# Patient Record
Sex: Male | Born: 1975 | Race: White | Hispanic: No | Marital: Single | Smoking: Never smoker
Health system: Southern US, Community
[De-identification: ages and names within clinical notes are randomized; demographics above are authoritative.]

## PROBLEM LIST (undated history)

## (undated) DIAGNOSIS — E119 Type 2 diabetes mellitus without complications: Secondary | ICD-10-CM

## (undated) DIAGNOSIS — I10 Essential (primary) hypertension: Secondary | ICD-10-CM

## (undated) HISTORY — PX: HERNIA REPAIR: SHX51

---

## 2008-01-25 ENCOUNTER — Emergency Department: Payer: Self-pay | Admitting: Emergency Medicine

## 2017-03-04 ENCOUNTER — Other Ambulatory Visit: Payer: Self-pay | Admitting: Family Medicine

## 2017-03-04 ENCOUNTER — Ambulatory Visit
Admission: RE | Admit: 2017-03-04 | Discharge: 2017-03-04 | Disposition: A | Discharge status: Home / Self Care | Payer: BLUE CROSS/BLUE SHIELD | Source: Ambulatory Visit | Attending: Family Medicine | Admitting: Family Medicine

## 2017-03-04 DIAGNOSIS — R197 Diarrhea, unspecified: Secondary | ICD-10-CM

## 2017-03-04 DIAGNOSIS — R1084 Generalized abdominal pain: Secondary | ICD-10-CM

## 2017-03-04 DIAGNOSIS — N136 Pyonephrosis: Secondary | ICD-10-CM | POA: Diagnosis not present

## 2017-03-04 DIAGNOSIS — M545 Low back pain: Secondary | ICD-10-CM | POA: Insufficient documentation

## 2017-03-04 DIAGNOSIS — I700 Atherosclerosis of aorta: Secondary | ICD-10-CM | POA: Insufficient documentation

## 2017-03-04 HISTORY — DX: Essential (primary) hypertension: I10

## 2017-03-04 HISTORY — DX: Type 2 diabetes mellitus without complications: E11.9

## 2017-03-04 LAB — POCT I-STAT CREATININE: Creatinine, Ser: 0.8 mg/dL (ref 0.61–1.24)

## 2017-03-04 MED ORDER — IOPAMIDOL (ISOVUE-300) INJECTION 61%
100.0000 mL | Freq: Once | INTRAVENOUS | Status: AC | PRN
  Administered 2017-03-04: 100 mL via INTRAVENOUS

## 2017-03-05 ENCOUNTER — Encounter: Payer: Self-pay | Admitting: Urology

## 2017-03-05 ENCOUNTER — Ambulatory Visit (INDEPENDENT_AMBULATORY_CARE_PROVIDER_SITE_OTHER): Payer: BLUE CROSS/BLUE SHIELD | Admitting: Urology

## 2017-03-05 ENCOUNTER — Ambulatory Visit: Payer: BLUE CROSS/BLUE SHIELD

## 2017-03-05 VITALS — BP 156/103 | HR 108 | Ht 70.0 in | Wt 189.9 lb

## 2017-03-05 DIAGNOSIS — N200 Calculus of kidney: Secondary | ICD-10-CM | POA: Diagnosis not present

## 2017-03-05 LAB — URINALYSIS, COMPLETE
Bilirubin, UA: NEGATIVE
Glucose, UA: NEGATIVE
Ketones, UA: NEGATIVE
LEUKOCYTES UA: NEGATIVE
Nitrite, UA: NEGATIVE
PH UA: 5.5 (ref 5.0–7.5)
Specific Gravity, UA: >1.03 — ABNORMAL HIGH (ref 1.005–1.030)
Urobilinogen, Ur: 0.2 mg/dL (ref 0.2–1.0)

## 2017-03-05 LAB — MICROSCOPIC EXAMINATION
Epithelial Cells (non renal): NONE SEEN /HPF
WBC, UA: NONE SEEN /HPF

## 2017-03-05 MED ORDER — ONDANSETRON HCL 4 MG PO TABS: 4.0000 mg | ORAL_TABLET | Freq: Three times a day (TID) | ORAL | 0 refills | Status: AC | PRN

## 2017-03-05 MED ORDER — TAMSULOSIN HCL 0.4 MG PO CAPS: 0.4000 mg | ORAL_CAPSULE | Freq: Every day | ORAL | 0 refills | Status: AC

## 2017-03-05 MED ORDER — TRAMADOL HCL 50 MG PO TABS: 50.0000 mg | ORAL_TABLET | Freq: Four times a day (QID) | ORAL | 0 refills | Status: DC | PRN

## 2017-03-05 NOTE — Progress Notes (Signed)
03/05/2017 2:19 PM   Luis Dickerson Nov 21, 1975 [id number]  Referring provider: Danella Penton, MD [phone] [facility] [address] [facility] [place], [place] [address]  Chief Complaint  Patient presents with  . New Patient (Initial Visit)  . Nephrolithiasis    HPI: 41 year old male who presents today for further evaluation of her right kidney stone. The patient was initially seen and evaluated by his primary care doctor who ordered a CT scan for back pain and chronic malaise. He was noted to have a 6-7 mm stone in the right UPJ with associated proximal hydronephrosis.  He also has bilateral nonobstructing stones. Patient denies any fevers or chills. He did have some nausea and vomiting several days ago. His pain has been present intermittently for the past 5 days.  The patient has no history of kidney stones. Does have a strong family history of kidney stones.   PMH: Past Medical History:  Diagnosis Date  . Diabetes mellitus without complication (HCC)   . Hypertension     Surgical History: Past Surgical History:  Procedure Laterality Date  . HERNIA REPAIR     58 or 41 years old    Home Medications:  Allergies as of 03/05/2017   No Known Allergies     Medication List       Accurate as of 03/05/17  2:19 PM. Always use your most recent med list.          amLODipine 5 MG tablet Commonly known as:  NORVASC TAKE 1 TABLET BY MOUTH EVERY NIGHT FOR BLOOD PRESSURE   cetirizine 10 MG tablet Commonly known as:  ZYRTEC Take by mouth.   ciprofloxacin 500 MG tablet Commonly known as:  CIPRO Take by mouth.   ondansetron 4 MG tablet Commonly known as:  ZOFRAN Take 1 tablet (4 mg total) by mouth every 8 (eight) hours as needed for nausea or vomiting.   tamsulosin 0.4 MG Caps capsule Commonly known as:  FLOMAX Take 1 capsule (0.4 mg total) by mouth daily.   traMADol 50 MG tablet Commonly known as:  ULTRAM Take 1 tablet (50 mg total) by mouth every 6 (six)  hours as needed.            Discharge Care Instructions        Start     Ordered   03/05/17 0000  Urinalysis, Complete     03/05/17 1310   03/05/17 0000  tamsulosin (FLOMAX) 0.4 MG CAPS capsule  Daily     03/05/17 1417   03/05/17 0000  traMADol (ULTRAM) 50 MG tablet  Every 6 hours PRN     03/05/17 1417   03/05/17 0000  ondansetron (ZOFRAN) 4 MG tablet  Every 8 hours PRN     03/05/17 1417   03/05/17 0000  Abdomen 1 view (KUB)    Question Answer Comment  Reason for Exam (SYMPTOM  OR DIAGNOSIS REQUIRED) kidney stone   Preferred imaging location? [facility]   Radiology Contrast Protocol - do NOT remove file path [email or web address]      03/05/17 1417      Allergies: No Known Allergies  Family History: Family History  Problem Relation Age of Onset  . Nephrolithiasis Father   . Nephrolithiasis Paternal Grandfather   . Prostate cancer Paternal Grandfather   . Kidney cancer Neg Hx   . Kidney disease Neg Hx     Social History:  reports that he has never smoked. He has never used smokeless tobacco. He reports that he  drinks about 1.2 oz of alcohol per week . He reports that he does not use drugs.  ROS: UROLOGY Frequent Urination?: Yes Hard to postpone urination?: No Burning/pain with urination?: No Get up at night to urinate?: No Leakage of urine?: No Urine stream starts and stops?: No Trouble starting stream?: No Do you have to strain to urinate?: Yes Blood in urine?: No Urinary tract infection?: Yes Sexually transmitted disease?: No Injury to kidneys or bladder?: No Painful intercourse?: No Weak stream?: No Erection problems?: No Penile pain?: No  Gastrointestinal Nausea?: No Vomiting?: Yes Indigestion/heartburn?: No Diarrhea?: No Constipation?: No  Constitutional Fever: No Night sweats?: No Weight loss?: No Fatigue?: No  Skin Skin rash/lesions?: No Itching?: No  Eyes Blurred vision?:  Yes Double vision?: No  Ears/Nose/Throat Sore throat?: No Sinus problems?: No  Hematologic/Lymphatic Swollen glands?: No Easy bruising?: No  Cardiovascular Leg swelling?: No Chest pain?: No  Respiratory Cough?: No Shortness of breath?: No  Endocrine Excessive thirst?: Yes  Musculoskeletal Back pain?: Yes Joint pain?: Yes  Neurological Headaches?: No Dizziness?: No  Psychologic Depression?: No Anxiety?: No  Physical Exam: BP (!) 156/103   Pulse (!) 108   Ht 5\' 10"  (1.778 m)   Wt 86.1 kg (189 lb 14.4 oz)   BMI 27.25 kg/m   Constitutional:  Alert and oriented, No acute distress. HEENT: [place] AT, moist mucus membranes.  Trachea midline, no masses. Cardiovascular: No clubbing, cyanosis, or edema. Respiratory: Normal respiratory effort, no increased work of breathing. GI: Abdomen is soft, nontender, nondistended, no abdominal masses GU: No CVA tenderness.  Skin: No rashes, bruises or suspicious lesions. Lymph: No cervical or inguinal adenopathy. Neurologic: Grossly intact, no focal deficits, moving all 4 extremities. Psychiatric: Normal mood and affect.  Laboratory Data: No results found for: WBC, HGB, HCT, MCV, PLT  Lab Results  Component Value Date   CREATININE 0.80 03/04/2017    No results found for: PSA  No results found for: TESTOSTERONE  No results found for: HGBA1C  Urinalysis No results found for: COLORURINE, APPEARANCEUR, LABSPEC, PHURINE, GLUCOSEU, HGBUR, BILIRUBINUR, KETONESUR, PROTEINUR, UROBILINOGEN, NITRITE, LEUKOCYTESUR  Pertinent Imaging: I manipulated and reviewed the patient's CT scan demonstrating a 6-7 mm stone at the right UPJ. He also has bilateral nonobstructing stones.   Assessment & Plan:  I went over the treatment options with the patient and his current clinical situation. We discussed medical expulsion therapy, shockwave lithotripsy, and ureteroscopy. Currently, the patient's symptoms are fairly mild, and the patient feels  that he would like to try medical expulsion therapy for a period of time prior to deciding on any treatment. We discussed return precautions including fever, dehydration, and poorly controlled pain. I detailed the pain regimen for him including 800 mg Diprivan as well as tramadol. I have also given the patient tamsulosin to help with his medical expulsion therapy as well as Zofran for his nausea. We'll plan to follow-up the patient to exit KUB prior.  1. Kidney stones  - Urinalysis, Complete - Abdomen 1 view (KUB); Future   Return in about 2 weeks (around 03/19/2017).  Crist Fat, MD  [facility] [address] [place], [place] [address] [phone]

## 2017-03-07 NOTE — Progress Notes (Addendum)
03/08/2017 9:40 AM   Luis Dickerson Nov 30, 1975 [id number]  Referring provider: Rusty Aus, MD [facility] [place], [place] [address]  Chief Complaint  Patient presents with  . Follow-up    Discuss eswl     HPI: 41 year old WM who presents today to discuss ESWL as a possible definitive treatment for his right UPJ stone.  The patient was initially seen and evaluated by his primary care doctor who ordered a CT scan for back pain and chronic malaise. He was noted to have a 6-7 mm stone in the right UPJ with associated proximal hydronephrosis.  He also has bilateral nonobstructing stones. Patient denies any fevers or chills. He did have some nausea and vomiting several days ago. His pain has been present intermittently for the past 5 days.  The patient has no history of kidney stones. Does have a strong family history of kidney stones.  Today, he states he has not passed the stone.  He is still experiencing intermittent right-sided flank pain. He is not having fevers, chills, nausea or vomiting. He is not having gross hematuria.  His UA today is unremarkable.   PMH: Past Medical History:  Diagnosis Date  . Diabetes mellitus without complication ([place])   . Hypertension     Surgical History: Past Surgical History:  Procedure Laterality Date  . HERNIA REPAIR     74 or 41 years old    Home Medications:  Allergies as of 03/08/2017   No Known Allergies     Medication List       Accurate as of 03/08/17  9:40 AM. Always use your most recent med list.          amLODipine 5 MG tablet Commonly known as:  NORVASC TAKE 1 TABLET BY MOUTH EVERY NIGHT FOR BLOOD PRESSURE   cetirizine 10 MG tablet Commonly known as:  ZYRTEC Take by mouth.   ciprofloxacin 500 MG tablet Commonly known as:  CIPRO Take by mouth.   ondansetron 4 MG tablet Commonly known as:  ZOFRAN Take 1 tablet (4 mg total) by mouth every 8 (eight) hours as needed for  nausea or vomiting.   tamsulosin 0.4 MG Caps capsule Commonly known as:  FLOMAX Take 1 capsule (0.4 mg total) by mouth daily.   traMADol 50 MG tablet Commonly known as:  ULTRAM Take 1 tablet (50 mg total) by mouth every 6 (six) hours as needed.       Allergies: No Known Allergies  Family History: Family History  Problem Relation Age of Onset  . Nephrolithiasis Father   . Nephrolithiasis Paternal Grandfather   . Prostate cancer Paternal Grandfather   . Kidney cancer Neg Hx   . Kidney disease Neg Hx   . Bladder Cancer Neg Hx     Social History:  reports that he has never smoked. He has never used smokeless tobacco. He reports that he drinks about 1.2 oz of alcohol per week . He reports that he does not use drugs.  ROS: UROLOGY Frequent Urination?: No Hard to postpone urination?: No Burning/pain with urination?: No Get up at night to urinate?: No Leakage of urine?: No Urine stream starts and stops?: No Trouble starting stream?: No Do you have to strain to urinate?: No Blood in urine?: No Urinary tract infection?: No Sexually transmitted disease?: No Injury to kidneys or bladder?: No Painful intercourse?: No Weak stream?: No Erection problems?: No Penile pain?: No  Gastrointestinal Nausea?: No Vomiting?: No Indigestion/heartburn?: No  Diarrhea?: No Constipation?: No  Constitutional Fever: No Night sweats?: No Weight loss?: No Fatigue?: No  Skin Skin rash/lesions?: No Itching?: No  Eyes Blurred vision?: No Double vision?: No  Ears/Nose/Throat Sore throat?: No Sinus problems?: No  Hematologic/Lymphatic Swollen glands?: No Easy bruising?: No  Cardiovascular Leg swelling?: No Chest pain?: No  Respiratory Cough?: No Shortness of breath?: No  Endocrine Excessive thirst?: No  Musculoskeletal Back pain?: No Joint pain?: No  Neurological Headaches?: No Dizziness?: No  Psychologic Depression?: No Anxiety?: No  Physical Exam: BP (!)  146/98   Pulse 84   Ht _0  (1.778 m)   Wt 193 lb (87.5 kg)   BMI 27.69 kg/m   Constitutional:  Alert and oriented, No acute distress. HEENT: [place] AT, moist mucus membranes.  Trachea midline, no masses. Cardiovascular: No clubbing, cyanosis, or edema. Respiratory: Normal respiratory effort, no increased work of breathing. GI: Abdomen is soft, nontender, nondistended, no abdominal masses GU: No CVA tenderness.  Skin: No rashes, bruises or suspicious lesions. Lymph: No cervical or inguinal adenopathy. Neurologic: Grossly intact, no focal deficits, moving all 4 extremities. Psychiatric: Normal mood and affect.  Laboratory Data: Lab Results  Component Value Date   CREATININE 0.81 03/08/2017    Urinalysis Unremarkable.  See EPIC.    Pertinent Imaging: CLINICAL DATA:  Lower abdominal pain for 1 week, low back pain, being treated for UTI, history of inguinal hernia repair, hypertension, diabetes mellitus  EXAM: CT ABDOMEN AND PELVIS WITH CONTRAST  TECHNIQUE: Multidetector CT imaging of the abdomen and pelvis was performed using the standard protocol following bolus administration of intravenous contrast. Sagittal and coronal MPR images reconstructed from axial data set.  CONTRAST:  140m ISOVUE-300 IOPAMIDOL (ISOVUE-300) INJECTION 61% IV. Dilute oral contrast.  COMPARISON:  None  FINDINGS: Lower chest: Minimal dependent bibasilar atelectasis  Hepatobiliary: Gallbladder and liver normal appearance. No biliary dilatation.  Pancreas: Normal appearance  Spleen: Normal appearance  Adrenals/Urinary Tract: Adrenal glands normal appearance. Mild RIGHT hydronephrosis secondary to a 7 mm RIGHT UPJ calculus. Additional small BILATERAL nonobstructing renal calculi, tiny at inferior pole RIGHT kidney, 5 mm diameter at mid to inferior pole LEFT kidney. Small LEFT renal cysts. Ureters and bladder normal appearance next.  Stomach/Bowel: Normal appendix. Slight  hyperemia of the sigmoid mesocolon without wall thickening or definite surrounding pericolic inflammatory changes. Segment of jejuno-jejunal intussusception in the LEFT mid abdomen, likely physiologic, not seen on delayed images through the kidneys. No evidence of bowel dilatation/obstruction. Stomach and remaining bowel loops otherwise normal appearance.  Vascular/Lymphatic: Mild atherosclerotic calcification aorta. Aorta normal caliber. Scattered normal size mesenteric nodes without adenopathy.  Reproductive: Unremarkable prostate gland and seminal vesicles  Other: No free air or free fluid. No acute hernia or inflammatory process.  Musculoskeletal: Unremarkable  IMPRESSION: RIGHT hydronephrosis secondary to a 7 mm RIGHT UPJ calculus.  Additional BILATERAL nonobstructing renal calculi.  Transient/physiologic jejuno-jejunal intussusception in LEFT mid abdomen without evidence of bowel obstruction.  Aortic Atherosclerosis (ICD10-I70.0).   Electronically Signed   By: MLavonia DanaM.D.   On: 03/04/2017 17:24  I have independently reviewed the films.     Assessment & Plan:   Patient will return for right ESWL for a right  obstructing 7 mm UPJ stone for definitve treatment of the stone.   1. Right ureteral stone  - explained to the patient that AUA Guidelines for patients with uncomplicated ureteral stones ?10 mm should be offered observation, and those with distal stones of similar size should be offered MET  with ?-blockers  - if after 4 to 6 weeks observation with or without MET is not successful we would pursue URS or ESWL, if the patient/clinician decide to intervene sooner based on a shared decision making approach, the clinicians should offer definitive stone treatment  -URS would be the first lined therapy if stone(s) do not pass, but ESWL is the procedure with the least morbidity and lowest complication rate, but URS has a greater stone-free rate in a single  procedure  - Patient would like to pursue right ESWL for definitive treatment - stone < 1500 HU and skin to stone distance < 15 cm - explained success rates of 84%-89%  - I explained that ESWL is a means of pulverizing urinary stones without surgery using shockwave therapy  - I discussed the risks involved with ESWL consist of bruising to the skin and kidney region as a result of the shockwave, possibility of long-term kidney damage, development of high blood pressure and damage to the bowel or long, hematuria, urinary bleeding serious enough to require transfusion or surgical repair or removal of the kidney is rare, rare chance of hematoma formation in the kidney and injuries to the spleen, liver or pancreas.  There is also the risk of urinary tract infection or any infection of the blood system or tissue.   There is also a possibility of resultant damage to male organs.  - I explained that sometimes the stone fragments can stack up in the ureter like coins, a phenomenon described as "Steinstrasse", that would result in a stent placement and/or URS for further treatment  - I informed the patient that IV sedation is typically used on the truck, but it some rare instances we need to use general anesthesia - the risks being infection, irregular heart beat, irregular BP, stroke, MI, CVA, paralysis, coma and/or death.  - UA  - Urine culture  - CBC  - BMP  2. Right hydronephrosis  - obtain RUS to ensure the hydronephrosis has resolved once he has recovered from ESWL  3. Microscopic hematuria  - UA today demonstrates 0-5 RBC's.   - continue to monitor the patient's UA after the treatment/passage of the stone to ensure the hematuria has resolved  - if hematuria persists, we will pursue a hematuria workup with CT Urogram and cystoscopy if appropriate.     Return for Right ESWL.  Zara Council, PA-C  [facility] [address], [place], [place] [address] [phone]

## 2017-03-08 ENCOUNTER — Encounter: Payer: Self-pay | Admitting: Urology

## 2017-03-08 ENCOUNTER — Other Ambulatory Visit: Payer: Self-pay | Admitting: *Deleted

## 2017-03-08 ENCOUNTER — Other Ambulatory Visit
Admission: RE | Admit: 2017-03-08 | Discharge: 2017-03-08 | Disposition: A | Discharge status: Home / Self Care | Payer: BLUE CROSS/BLUE SHIELD | Source: Ambulatory Visit | Attending: Urology | Admitting: Urology

## 2017-03-08 ENCOUNTER — Ambulatory Visit: Payer: BLUE CROSS/BLUE SHIELD | Admitting: Urology

## 2017-03-08 VITALS — BP 146/98 | HR 84 | Ht 70.0 in | Wt 193.0 lb

## 2017-03-08 DIAGNOSIS — N201 Calculus of ureter: Secondary | ICD-10-CM

## 2017-03-08 DIAGNOSIS — N200 Calculus of kidney: Secondary | ICD-10-CM

## 2017-03-08 DIAGNOSIS — R3129 Other microscopic hematuria: Secondary | ICD-10-CM | POA: Diagnosis not present

## 2017-03-08 DIAGNOSIS — N132 Hydronephrosis with renal and ureteral calculous obstruction: Secondary | ICD-10-CM | POA: Diagnosis not present

## 2017-03-08 LAB — CBC WITH DIFFERENTIAL/PLATELET
BASOS ABS: 0 10*3/uL (ref 0–0.1)
Basophils Relative: 1 %
EOS ABS: 0.1 10*3/uL (ref 0–0.7)
EOS PCT: 2 %
HCT: 45.9 % (ref 40.0–52.0)
Hemoglobin: 15.6 g/dL (ref 13.0–18.0)
LYMPHS PCT: 34 %
Lymphs Abs: 2.2 10*3/uL (ref 1.0–3.6)
MCH: 29.5 pg (ref 26.0–34.0)
MCHC: 34 g/dL (ref 32.0–36.0)
MCV: 86.8 fL (ref 80.0–100.0)
MONO ABS: 0.7 10*3/uL (ref 0.2–1.0)
Monocytes Relative: 11 %
Neutro Abs: 3.4 10*3/uL (ref 1.4–6.5)
Neutrophils Relative %: 52 %
Platelets: 207 10*3/uL (ref 150–440)
RBC: 5.29 MIL/uL (ref 4.40–5.90)
RDW: 12.9 % (ref 11.5–14.5)
WBC: 6.5 10*3/uL (ref 3.8–10.6)

## 2017-03-08 LAB — BASIC METABOLIC PANEL
Anion gap: 6 (ref 5–15)
BUN: 15 mg/dL (ref 6–20)
CALCIUM: 9.4 mg/dL (ref 8.9–10.3)
CO2: 27 mmol/L (ref 22–32)
CREATININE: 0.81 mg/dL (ref 0.61–1.24)
Chloride: 100 mmol/L — ABNORMAL LOW (ref 101–111)
GFR calc Af Amer: >60 mL/min (ref >60)
GLUCOSE: 145 mg/dL — AB (ref 65–99)
Potassium: 4.4 mmol/L (ref 3.5–5.1)
SODIUM: 133 mmol/L — AB (ref 135–145)

## 2017-03-08 LAB — URINALYSIS, COMPLETE (UACMP) WITH MICROSCOPIC
BILIRUBIN URINE: NEGATIVE
Bacteria, UA: NONE SEEN
Glucose, UA: NEGATIVE mg/dL
KETONES UR: NEGATIVE mg/dL
LEUKOCYTES UA: NEGATIVE
NITRITE: NEGATIVE
PROTEIN: NEGATIVE mg/dL
SPECIFIC GRAVITY, URINE: 1.01 (ref 1.005–1.030)
SQUAMOUS EPITHELIAL / LPF: NONE SEEN
pH: 7 (ref 5.0–8.0)

## 2017-03-09 LAB — URINE CULTURE: CULTURE: NO GROWTH

## 2017-03-12 ENCOUNTER — Other Ambulatory Visit: Payer: Self-pay | Admitting: Radiology

## 2017-03-12 DIAGNOSIS — N201 Calculus of ureter: Secondary | ICD-10-CM

## 2017-03-13 MED ORDER — CIPROFLOXACIN HCL 500 MG PO TABS
500.0000 mg | ORAL_TABLET | ORAL | Status: AC
  Administered 2017-03-14: 500 mg via ORAL

## 2017-03-14 ENCOUNTER — Ambulatory Visit: Payer: BLUE CROSS/BLUE SHIELD

## 2017-03-14 ENCOUNTER — Encounter
Admission: RE | Disposition: A | Discharge status: Home / Self Care | Payer: Self-pay | Source: Ambulatory Visit | Attending: Urology

## 2017-03-14 ENCOUNTER — Encounter: Payer: Self-pay | Admitting: *Deleted

## 2017-03-14 ENCOUNTER — Ambulatory Visit
Admission: RE | Admit: 2017-03-14 | Discharge: 2017-03-14 | Disposition: A | Discharge status: Home / Self Care | Payer: BLUE CROSS/BLUE SHIELD | Source: Ambulatory Visit | Attending: Urology | Admitting: Urology

## 2017-03-14 DIAGNOSIS — Z79899 Other long term (current) drug therapy: Secondary | ICD-10-CM | POA: Insufficient documentation

## 2017-03-14 DIAGNOSIS — N201 Calculus of ureter: Secondary | ICD-10-CM | POA: Diagnosis present

## 2017-03-14 DIAGNOSIS — I10 Essential (primary) hypertension: Secondary | ICD-10-CM | POA: Diagnosis not present

## 2017-03-14 DIAGNOSIS — E119 Type 2 diabetes mellitus without complications: Secondary | ICD-10-CM | POA: Insufficient documentation

## 2017-03-14 DIAGNOSIS — N210 Calculus in bladder: Secondary | ICD-10-CM | POA: Insufficient documentation

## 2017-03-14 HISTORY — PX: EXTRACORPOREAL SHOCK WAVE LITHOTRIPSY: SHX1557

## 2017-03-14 LAB — GLUCOSE, CAPILLARY: GLUCOSE-CAPILLARY: 123 mg/dL — AB (ref 65–99)

## 2017-03-14 SURGERY — LITHOTRIPSY, ESWL
Anesthesia: Moderate Sedation | Laterality: Right

## 2017-03-14 MED ORDER — CIPROFLOXACIN HCL 500 MG PO TABS
ORAL_TABLET | ORAL | Status: AC
  Administered 2017-03-14: 500 mg via ORAL
  Filled 2017-03-14: qty 1

## 2017-03-14 MED ORDER — ONDANSETRON HCL 4 MG/2ML IJ SOLN
INTRAMUSCULAR | Status: AC
  Administered 2017-03-14: 4 mg via INTRAVENOUS
  Filled 2017-03-14: qty 2

## 2017-03-14 MED ORDER — DIAZEPAM 5 MG PO TABS
10.0000 mg | ORAL_TABLET | ORAL | Status: AC
  Administered 2017-03-14: 10 mg via ORAL

## 2017-03-14 MED ORDER — DIPHENHYDRAMINE HCL 25 MG PO CAPS
ORAL_CAPSULE | ORAL | Status: AC
  Administered 2017-03-14: 25 mg via ORAL
  Filled 2017-03-14: qty 1

## 2017-03-14 MED ORDER — TRAMADOL HCL 50 MG PO TABS: 50.0000 mg | ORAL_TABLET | Freq: Four times a day (QID) | ORAL | 0 refills | Status: AC | PRN

## 2017-03-14 MED ORDER — SODIUM CHLORIDE 0.9 % IV SOLN
INTRAVENOUS | Status: DC
  Administered 2017-03-14: 08:00:00 via INTRAVENOUS

## 2017-03-14 MED ORDER — DIAZEPAM 5 MG PO TABS
ORAL_TABLET | ORAL | Status: AC
  Administered 2017-03-14: 10 mg via ORAL
  Filled 2017-03-14: qty 2

## 2017-03-14 MED ORDER — DIPHENHYDRAMINE HCL 25 MG PO CAPS
25.0000 mg | ORAL_CAPSULE | ORAL | Status: AC
  Administered 2017-03-14: 25 mg via ORAL

## 2017-03-14 MED ORDER — ONDANSETRON HCL 4 MG/2ML IJ SOLN
4.0000 mg | Freq: Once | INTRAMUSCULAR | Status: AC
  Administered 2017-03-14: 4 mg via INTRAVENOUS

## 2017-03-14 NOTE — H&P (View-Only) (Signed)
03/08/2017 9:40 AM   Luis Dickerson Nov 30, 1975 [id number]  Referring provider: Rusty Aus, MD [facility] [place], [place] [address]  Chief Complaint  Patient presents with  . Follow-up    Discuss eswl     HPI: 41 year old WM who presents today to discuss ESWL as a possible definitive treatment for his right UPJ stone.  The patient was initially seen and evaluated by his primary care doctor who ordered a CT scan for back pain and chronic malaise. He was noted to have a 6-7 mm stone in the right UPJ with associated proximal hydronephrosis.  He also has bilateral nonobstructing stones. Patient denies any fevers or chills. He did have some nausea and vomiting several days ago. His pain has been present intermittently for the past 5 days.  The patient has no history of kidney stones. Does have a strong family history of kidney stones.  Today, he states he has not passed the stone.  He is still experiencing intermittent right-sided flank pain. He is not having fevers, chills, nausea or vomiting. He is not having gross hematuria.  His UA today is unremarkable.   PMH: Past Medical History:  Diagnosis Date  . Diabetes mellitus without complication ([place])   . Hypertension     Surgical History: Past Surgical History:  Procedure Laterality Date  . HERNIA REPAIR     74 or 41 years old    Home Medications:  Allergies as of 03/08/2017   No Known Allergies     Medication List       Accurate as of 03/08/17  9:40 AM. Always use your most recent med list.          amLODipine 5 MG tablet Commonly known as:  NORVASC TAKE 1 TABLET BY MOUTH EVERY NIGHT FOR BLOOD PRESSURE   cetirizine 10 MG tablet Commonly known as:  ZYRTEC Take by mouth.   ciprofloxacin 500 MG tablet Commonly known as:  CIPRO Take by mouth.   ondansetron 4 MG tablet Commonly known as:  ZOFRAN Take 1 tablet (4 mg total) by mouth every 8 (eight) hours as needed for  nausea or vomiting.   tamsulosin 0.4 MG Caps capsule Commonly known as:  FLOMAX Take 1 capsule (0.4 mg total) by mouth daily.   traMADol 50 MG tablet Commonly known as:  ULTRAM Take 1 tablet (50 mg total) by mouth every 6 (six) hours as needed.       Allergies: No Known Allergies  Family History: Family History  Problem Relation Age of Onset  . Nephrolithiasis Father   . Nephrolithiasis Paternal Grandfather   . Prostate cancer Paternal Grandfather   . Kidney cancer Neg Hx   . Kidney disease Neg Hx   . Bladder Cancer Neg Hx     Social History:  reports that he has never smoked. He has never used smokeless tobacco. He reports that he drinks about 1.2 oz of alcohol per week . He reports that he does not use drugs.  ROS: UROLOGY Frequent Urination?: No Hard to postpone urination?: No Burning/pain with urination?: No Get up at night to urinate?: No Leakage of urine?: No Urine stream starts and stops?: No Trouble starting stream?: No Do you have to strain to urinate?: No Blood in urine?: No Urinary tract infection?: No Sexually transmitted disease?: No Injury to kidneys or bladder?: No Painful intercourse?: No Weak stream?: No Erection problems?: No Penile pain?: No  Gastrointestinal Nausea?: No Vomiting?: No Indigestion/heartburn?: No  Diarrhea?: No Constipation?: No  Constitutional Fever: No Night sweats?: No Weight loss?: No Fatigue?: No  Skin Skin rash/lesions?: No Itching?: No  Eyes Blurred vision?: No Double vision?: No  Ears/Nose/Throat Sore throat?: No Sinus problems?: No  Hematologic/Lymphatic Swollen glands?: No Easy bruising?: No  Cardiovascular Leg swelling?: No Chest pain?: No  Respiratory Cough?: No Shortness of breath?: No  Endocrine Excessive thirst?: No  Musculoskeletal Back pain?: No Joint pain?: No  Neurological Headaches?: No Dizziness?: No  Psychologic Depression?: No Anxiety?: No  Physical Exam: BP (!)  146/98   Pulse 84   Ht _0  (1.778 m)   Wt 193 lb (87.5 kg)   BMI 27.69 kg/m   Constitutional:  Alert and oriented, No acute distress. HEENT: [place] AT, moist mucus membranes.  Trachea midline, no masses. Cardiovascular: No clubbing, cyanosis, or edema. Respiratory: Normal respiratory effort, no increased work of breathing. GI: Abdomen is soft, nontender, nondistended, no abdominal masses GU: No CVA tenderness.  Skin: No rashes, bruises or suspicious lesions. Lymph: No cervical or inguinal adenopathy. Neurologic: Grossly intact, no focal deficits, moving all 4 extremities. Psychiatric: Normal mood and affect.  Laboratory Data: Lab Results  Component Value Date   CREATININE 0.81 03/08/2017    Urinalysis Unremarkable.  See EPIC.    Pertinent Imaging: CLINICAL DATA:  Lower abdominal pain for 1 week, low back pain, being treated for UTI, history of inguinal hernia repair, hypertension, diabetes mellitus  EXAM: CT ABDOMEN AND PELVIS WITH CONTRAST  TECHNIQUE: Multidetector CT imaging of the abdomen and pelvis was performed using the standard protocol following bolus administration of intravenous contrast. Sagittal and coronal MPR images reconstructed from axial data set.  CONTRAST:  140m ISOVUE-300 IOPAMIDOL (ISOVUE-300) INJECTION 61% IV. Dilute oral contrast.  COMPARISON:  None  FINDINGS: Lower chest: Minimal dependent bibasilar atelectasis  Hepatobiliary: Gallbladder and liver normal appearance. No biliary dilatation.  Pancreas: Normal appearance  Spleen: Normal appearance  Adrenals/Urinary Tract: Adrenal glands normal appearance. Mild RIGHT hydronephrosis secondary to a 7 mm RIGHT UPJ calculus. Additional small BILATERAL nonobstructing renal calculi, tiny at inferior pole RIGHT kidney, 5 mm diameter at mid to inferior pole LEFT kidney. Small LEFT renal cysts. Ureters and bladder normal appearance next.  Stomach/Bowel: Normal appendix. Slight  hyperemia of the sigmoid mesocolon without wall thickening or definite surrounding pericolic inflammatory changes. Segment of jejuno-jejunal intussusception in the LEFT mid abdomen, likely physiologic, not seen on delayed images through the kidneys. No evidence of bowel dilatation/obstruction. Stomach and remaining bowel loops otherwise normal appearance.  Vascular/Lymphatic: Mild atherosclerotic calcification aorta. Aorta normal caliber. Scattered normal size mesenteric nodes without adenopathy.  Reproductive: Unremarkable prostate gland and seminal vesicles  Other: No free air or free fluid. No acute hernia or inflammatory process.  Musculoskeletal: Unremarkable  IMPRESSION: RIGHT hydronephrosis secondary to a 7 mm RIGHT UPJ calculus.  Additional BILATERAL nonobstructing renal calculi.  Transient/physiologic jejuno-jejunal intussusception in LEFT mid abdomen without evidence of bowel obstruction.  Aortic Atherosclerosis (ICD10-I70.0).   Electronically Signed   By: MLavonia DanaM.D.   On: 03/04/2017 17:24  I have independently reviewed the films.     Assessment & Plan:   Patient will return for right ESWL for a right  obstructing 7 mm UPJ stone for definitve treatment of the stone.   1. Right ureteral stone  - explained to the patient that AUA Guidelines for patients with uncomplicated ureteral stones ?10 mm should be offered observation, and those with distal stones of similar size should be offered MET  with ?-blockers  - if after 4 to 6 weeks observation with or without MET is not successful we would pursue URS or ESWL, if the patient/clinician decide to intervene sooner based on a shared decision making approach, the clinicians should offer definitive stone treatment  -URS would be the first lined therapy if stone(s) do not pass, but ESWL is the procedure with the least morbidity and lowest complication rate, but URS has a greater stone-free rate in a single  procedure  - Patient would like to pursue right ESWL for definitive treatment - stone < 1500 HU and skin to stone distance < 15 cm - explained success rates of 84%-89%  - I explained that ESWL is a means of pulverizing urinary stones without surgery using shockwave therapy  - I discussed the risks involved with ESWL consist of bruising to the skin and kidney region as a result of the shockwave, possibility of long-term kidney damage, development of high blood pressure and damage to the bowel or long, hematuria, urinary bleeding serious enough to require transfusion or surgical repair or removal of the kidney is rare, rare chance of hematoma formation in the kidney and injuries to the spleen, liver or pancreas.  There is also the risk of urinary tract infection or any infection of the blood system or tissue.   There is also a possibility of resultant damage to male organs.  - I explained that sometimes the stone fragments can stack up in the ureter like coins, a phenomenon described as "Steinstrasse", that would result in a stent placement and/or URS for further treatment  - I informed the patient that IV sedation is typically used on the truck, but it some rare instances we need to use general anesthesia - the risks being infection, irregular heart beat, irregular BP, stroke, MI, CVA, paralysis, coma and/or death.  - UA  - Urine culture  - CBC  - BMP  2. Right hydronephrosis  - obtain RUS to ensure the hydronephrosis has resolved once he has recovered from ESWL  3. Microscopic hematuria  - UA today demonstrates 0-5 RBC's.   - continue to monitor the patient's UA after the treatment/passage of the stone to ensure the hematuria has resolved  - if hematuria persists, we will pursue a hematuria workup with CT Urogram and cystoscopy if appropriate.     Return for Right ESWL.  Zara Council, PA-C  [facility] [address], [place], [place] [address] [phone]

## 2017-03-14 NOTE — Discharge Instructions (Signed)
See [facility] discharge instructions in chart.  FOLLOW DR. BRANDON'S POSTOP DISCHARGE INSTRUCTIONS AS REVIEWED.

## 2017-03-14 NOTE — Interval H&P Note (Signed)
History and Physical Interval Note:  03/14/2017 8:26 AM  Luis Dickerson  has presented today for surgery, with the diagnosis of Kidney stone  The various methods of treatment have been discussed with the patient and family. After consideration of risks, benefits and other options for treatment, the patient has consented to  Procedure(s): EXTRACORPOREAL SHOCK WAVE LITHOTRIPSY (ESWL) (Right) as a surgical intervention .  The patient's history has been reviewed, patient examined, no change in status, stable for surgery.  I have reviewed the patient's chart and labs.  Questions were answered to the patient's satisfaction.   RRR CTAB   [name]

## 2017-03-18 ENCOUNTER — Ambulatory Visit: Payer: BLUE CROSS/BLUE SHIELD

## 2017-03-20 ENCOUNTER — Telehealth: Payer: Self-pay | Admitting: Urology

## 2017-03-20 NOTE — Telephone Encounter (Signed)
Spoke with patient and he explained his symptoms were mild and he wanted to know if some lower back pain and some discomfort with urination could be expected after litho. He is actively passing fragments. Patient was encourage to push fluids to help pass fragments and some discomfort is to be expected if his pain worsens he may come to the office for a nurse visit with KUB and UA. Patient verbalized agreement with this plan.

## 2017-03-20 NOTE — Telephone Encounter (Signed)
Pt called office stating that he had his kidney stone crushed last Thurs morning, Pt has questions about discharge and pain, and urgency. Feels like he is peeing needles and seeing blood and tissue at the end of the day. Please advise. Thanks.

## 2017-03-26 NOTE — Progress Notes (Signed)
03/28/2017 6:47 PM   Luis Dickerson Feb 19, 1976 [id number]  Referring provider: Danella Penton, MD [phone] [facility] [address] [facility] [place], [place] [address]  Chief Complaint  Patient presents with  . Results    kub  . Follow-up    2 week ESWL    HPI: 41 yo WM who is s/p ESWL for a right UPJ stone.  Background history 41 year old WM who presents today to discuss ESWL as a possible definitive treatment for his right UPJ stone.  The patient was initially seen and evaluated by his primary care doctor who ordered a CT scan for back pain and chronic malaise. He was noted to have a 6-7 mm stone in the right UPJ with associated proximal hydronephrosis.  He also has bilateral nonobstructing stones. Patient denies any fevers or chills. He did have some nausea and vomiting several days ago. His pain has been present intermittently for the past 5 days.  The patient has no history of kidney stones. Does have a strong family history of kidney stones.  He underwent right ESWL on 03/14/2017.  Stone appeared smudged after the procedure.  Since the procedure, he had some mild right CVA tenderness.    He did not have fevers, chills, nausea or vomiting.    KUB taken today noted a right ureter stone is no longer confidently identified. There is a questionable density which could be in the proximal right ureter.  Bilateral renal calculi.  Today, He denies any gross hematuria or flank pain. He has not passed any fragments. He has not had any fevers, chills, nausea or vomiting.     PMH: Past Medical History:  Diagnosis Date  . Diabetes mellitus without complication ([facility])   . Hypertension     Surgical History: Past Surgical History:  Procedure Laterality Date  . EXTRACORPOREAL SHOCK WAVE LITHOTRIPSY Right 03/14/2017   Procedure: EXTRACORPOREAL SHOCK WAVE LITHOTRIPSY (ESWL);  Surgeon: Vanna Scotland, MD;  Location: [facility] ORS;  Service: Urology;  Laterality: Right;  . HERNIA  REPAIR     80 or 41 years old    Home Medications:  Allergies as of 03/28/2017   No Known Allergies     Medication List       Accurate as of 03/28/17 11:59 PM. Always use your most recent med list.          amLODipine 5 MG tablet Commonly known as:  NORVASC TAKE 1 TABLET BY MOUTH EVERY NIGHT FOR BLOOD PRESSURE   cetirizine 10 MG tablet Commonly known as:  ZYRTEC Take by mouth.   ondansetron 4 MG tablet Commonly known as:  ZOFRAN Take 1 tablet (4 mg total) by mouth every 8 (eight) hours as needed for nausea or vomiting.   tamsulosin 0.4 MG Caps capsule Commonly known as:  FLOMAX Take 1 capsule (0.4 mg total) by mouth daily.   traMADol 50 MG tablet Commonly known as:  ULTRAM Take 1 tablet (50 mg total) by mouth every 6 (six) hours as needed.            Discharge Care Instructions        Start     Ordered   03/28/17 0000  US RENAL    Question Answer Comment  Reason for Exam (SYMPTOM  OR DIAGNOSIS REQUIRED) Hydronephrosis   Preferred imaging location? [facility]      03/28/17 1644      Allergies: No Known Allergies  Family History: Family History  Problem Relation Age of Onset  .  Nephrolithiasis Father   . Nephrolithiasis Paternal Grandfather   . Prostate cancer Paternal Grandfather   . Kidney cancer Neg Hx   . Kidney disease Neg Hx   . Bladder Cancer Neg Hx     Social History:  reports that he has never smoked. He has never used smokeless tobacco. He reports that he drinks about 1.2 oz of alcohol per week . He reports that he does not use drugs.  ROS: UROLOGY Frequent Urination?: No Hard to postpone urination?: No Burning/pain with urination?: No Get up at night to urinate?: No Leakage of urine?: No Urine stream starts and stops?: No Trouble starting stream?: No Do you have to strain to urinate?: No Blood in urine?: No Urinary tract infection?: No Sexually transmitted disease?: No Injury to kidneys or bladder?: No Painful  intercourse?: No Weak stream?: No Erection problems?: No Penile pain?: No  Gastrointestinal Nausea?: No Vomiting?: No Indigestion/heartburn?: No Diarrhea?: No Constipation?: No  Constitutional Fever: No Night sweats?: No Weight loss?: No Fatigue?: No  Skin Skin rash/lesions?: No Itching?: No  Eyes Blurred vision?: No Double vision?: No  Ears/Nose/Throat Sore throat?: No Sinus problems?: No  Hematologic/Lymphatic Swollen glands?: No Easy bruising?: No  Cardiovascular Leg swelling?: No Chest pain?: No  Respiratory Cough?: No Shortness of breath?: No  Endocrine Excessive thirst?: No  Musculoskeletal Back pain?: No Joint pain?: No  Neurological Headaches?: No Dizziness?: No  Psychologic Depression?: No Anxiety?: No  Physical Exam: BP (!) 143/94   Pulse (!) 101   Ht  (1.778 m)   Wt 190 lb 9.6 oz (86.5 kg)   BMI 27.35 kg/m   Constitutional:  Alert and oriented, No acute distress. HEENT: [place] AT, moist mucus membranes.  Trachea midline, no masses. Cardiovascular: No clubbing, cyanosis, or edema. Respiratory: Normal respiratory effort, no increased work of breathing. GI: Abdomen is soft, nontender, nondistended, no abdominal masses GU: No CVA tenderness.  Skin: No rashes, bruises or suspicious lesions. Lymph: No cervical or inguinal adenopathy. Neurologic: Grossly intact, no focal deficits, moving all 4 extremities. Psychiatric: Normal mood and affect.  Laboratory Data: Lab Results  Component Value Date   CREATININE 0.81 03/08/2017    I have reviewed the labs  Pertinent Imaging:  CLINICAL DATA:  Lithotripsy 2 weeks ago. History of bilateral stones.  EXAM: ABDOMEN - 1 VIEW  COMPARISON:  03/14/2017 and 03/04/2017  FINDINGS: Stable 6 mm stone in the left kidney. Question a tiny stone in the proximal right ureter between the L2 and L3 transverse processes. Stable punctate stone in the right kidney lower pole. Stable phlebolith  in the right hemipelvis. No acute bone abnormality.  IMPRESSION: Right ureter stone is no longer confidently identified. There is a questionable density which could be in the proximal right ureter.  Bilateral renal calculi.   Electronically Signed   By: Richarda Overlie M.D.   On: 03/28/2017 09:18   I have independently reviewed the films.     Assessment & Plan:    1. Right ureteral stone  - s/p right ESWL    2. Right hydronephrosis  - obtain RUS to ensure the hydronephrosis has resolved   3. Microscopic hematuria  - UA today demonstrates 0-5 RBC's.   - continue to monitor the patient's UA after the treatment/passage of the stone to ensure the hematuria has resolved  - if hematuria persists, we will pursue a hematuria workup with CT Urogram and cystoscopy if appropriate.     Return for RUS report and UA.  Zara Council, PA-C  [facility] [address], [place], [place] [address] [phone]

## 2017-03-27 ENCOUNTER — Ambulatory Visit
Admission: RE | Admit: 2017-03-27 | Discharge: 2017-03-27 | Disposition: A | Discharge status: Home / Self Care | Payer: BLUE CROSS/BLUE SHIELD | Source: Ambulatory Visit | Attending: Urology | Admitting: Urology

## 2017-03-27 DIAGNOSIS — N200 Calculus of kidney: Secondary | ICD-10-CM

## 2017-03-28 ENCOUNTER — Encounter: Payer: Self-pay | Admitting: Urology

## 2017-03-28 ENCOUNTER — Ambulatory Visit (INDEPENDENT_AMBULATORY_CARE_PROVIDER_SITE_OTHER): Payer: BLUE CROSS/BLUE SHIELD | Admitting: Urology

## 2017-03-28 VITALS — BP 143/94 | HR 101 | Ht 70.0 in | Wt 190.6 lb

## 2017-03-28 DIAGNOSIS — N132 Hydronephrosis with renal and ureteral calculous obstruction: Secondary | ICD-10-CM

## 2017-03-28 DIAGNOSIS — N201 Calculus of ureter: Secondary | ICD-10-CM | POA: Diagnosis not present

## 2017-03-28 DIAGNOSIS — R3129 Other microscopic hematuria: Secondary | ICD-10-CM | POA: Diagnosis not present

## 2017-04-29 ENCOUNTER — Ambulatory Visit: Payer: BLUE CROSS/BLUE SHIELD

## 2017-11-06 IMAGING — CT CT ABD-PELV W/ CM
2 of 5 series · 15 of 46 positions shown, 17 images · IV contrast (APPLIED)
Comparison: None

CLINICAL DATA: Lower abdominal pain for 1 week, low back pain,
being treated for UTI, history of inguinal hernia repair,
hypertension, diabetes mellitus

EXAM:
CT ABDOMEN AND PELVIS WITH CONTRAST
TECHNIQUE: Multidetector CT imaging of the abdomen and pelvis was performed
using the standard protocol following bolus administration of
intravenous contrast. Sagittal and coronal MPR images reconstructed
from axial data set.
CONTRAST:  100mL LAF4ZG-CKK IOPAMIDOL (LAF4ZG-CKK) INJECTION 61% IV.
Dilute oral contrast.

[Series 2: routine abd/pel with · axial · 0.73mm/px · z∈[-520,-55]mm · 12 of 105 slices shown, 14 images]
[im 6/105  soft-tissue]
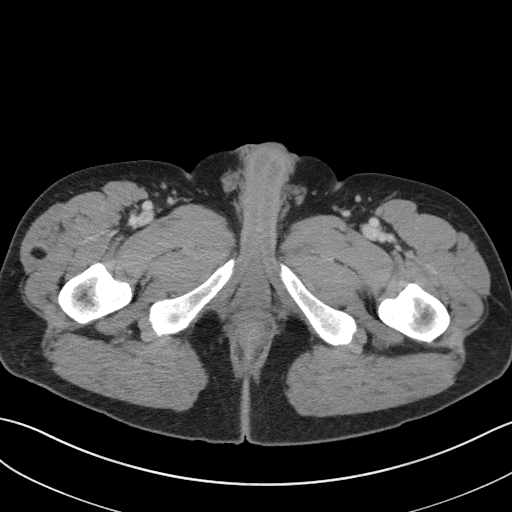
[im 6/105  bone]
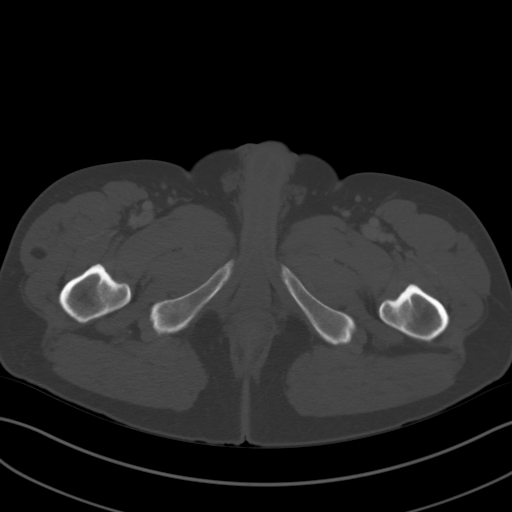
[im 18/105  soft-tissue]
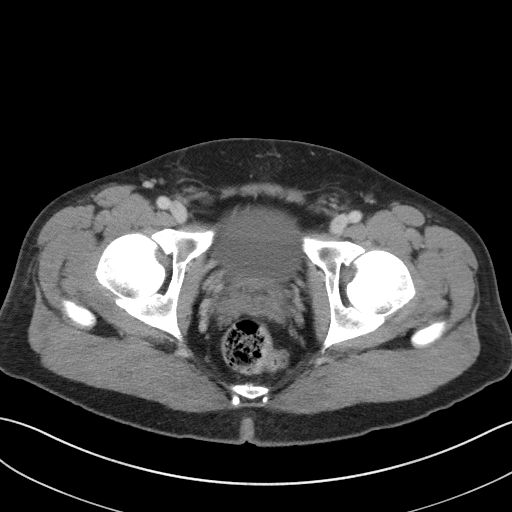
[im 24/105  soft-tissue]
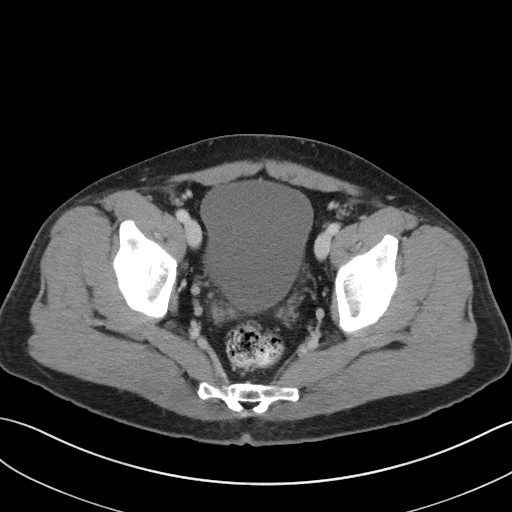
[im 29/105  soft-tissue]
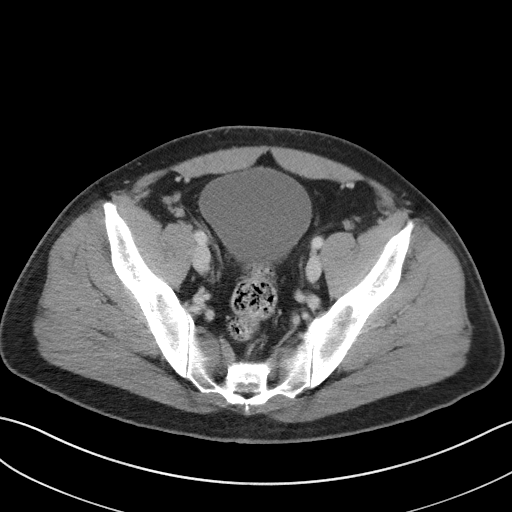
[im 41/105  soft-tissue]
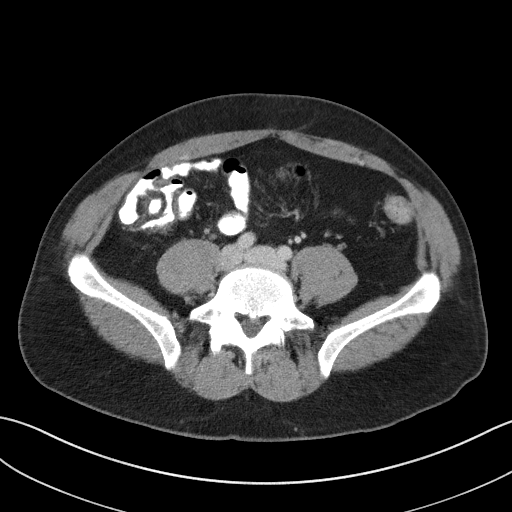
[im 47/105  soft-tissue]
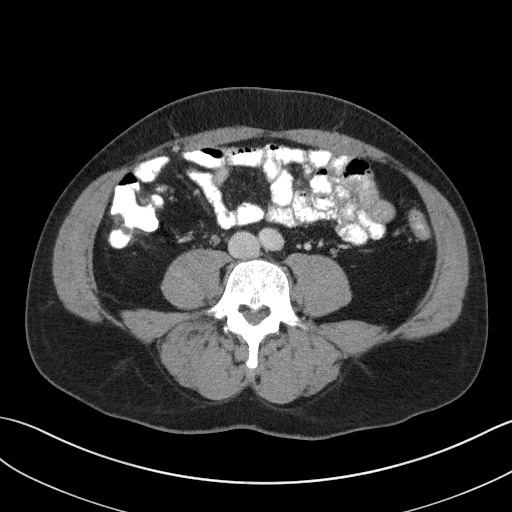
[im 58/105  soft-tissue]
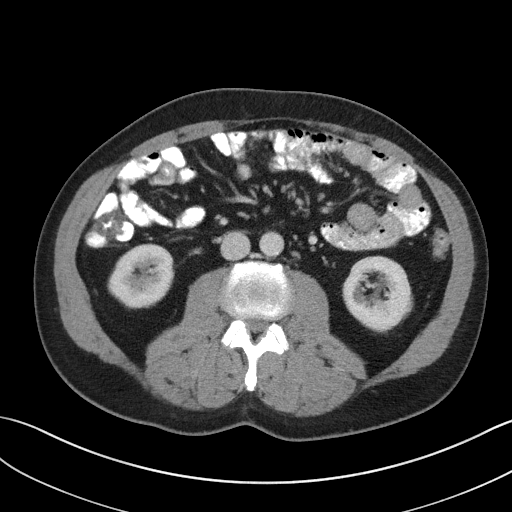
[im 64/105  soft-tissue]
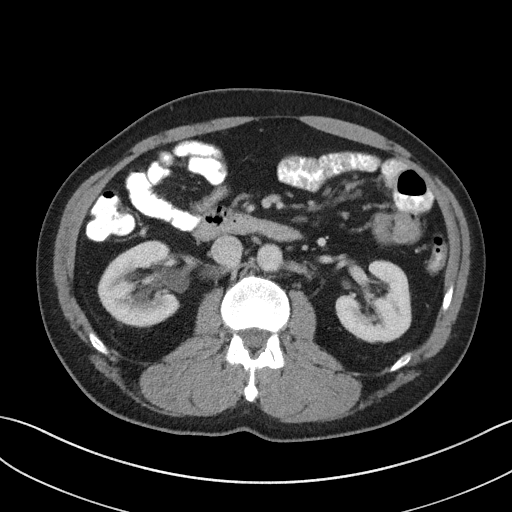
[im 76/105  soft-tissue]
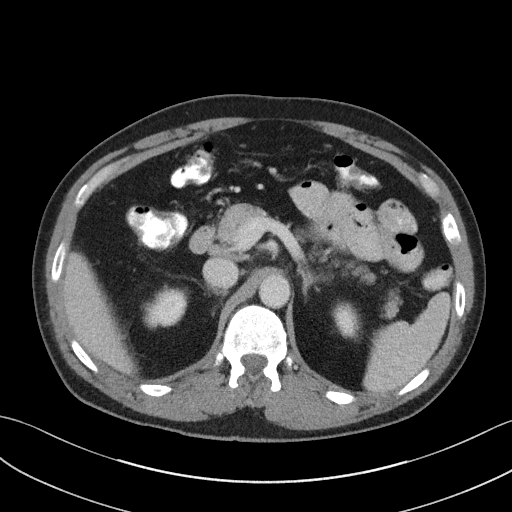
[im 76/105  bone]
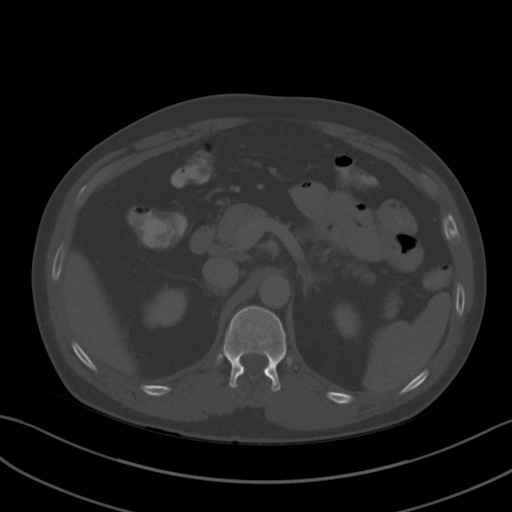
[im 81/105  soft-tissue]
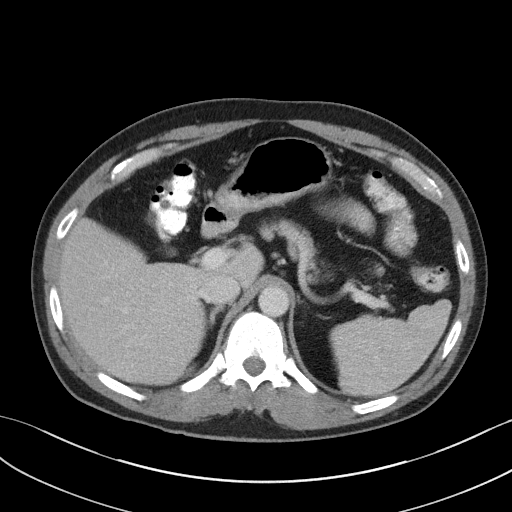
[im 87/105  soft-tissue]
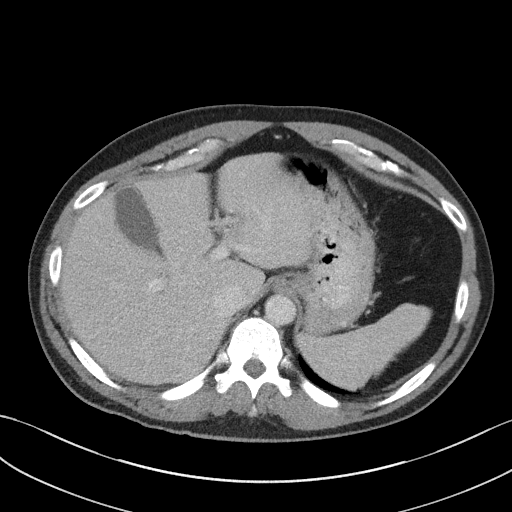
[im 99/105  soft-tissue]
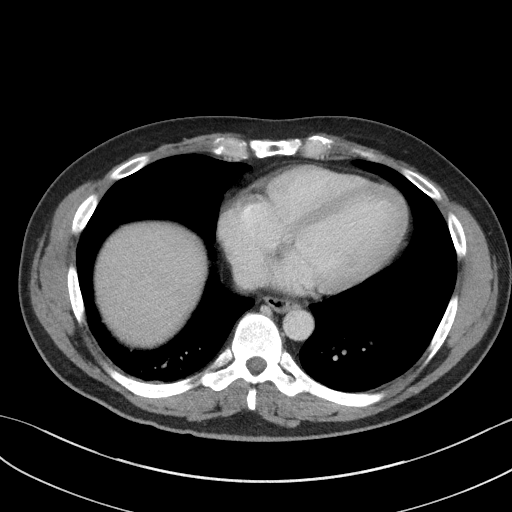

[Series 4: coronal st · coronal · 0.71mm/px · 3 of 85 slices shown]
[im 29/85  soft-tissue]
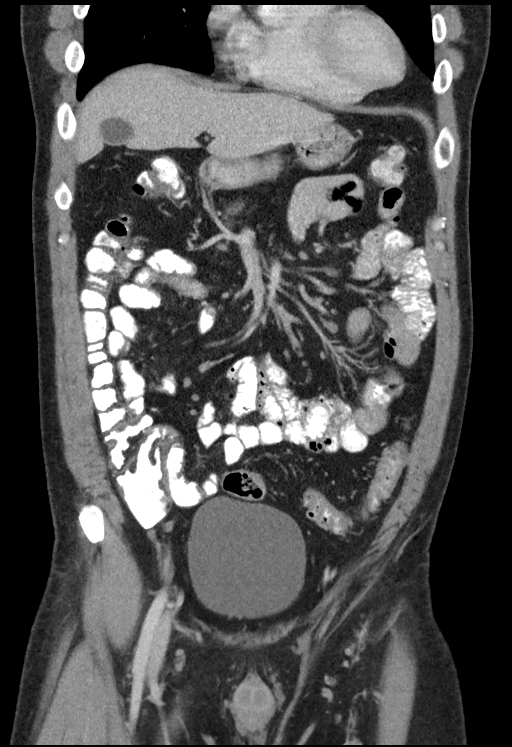
[im 38/85  soft-tissue]
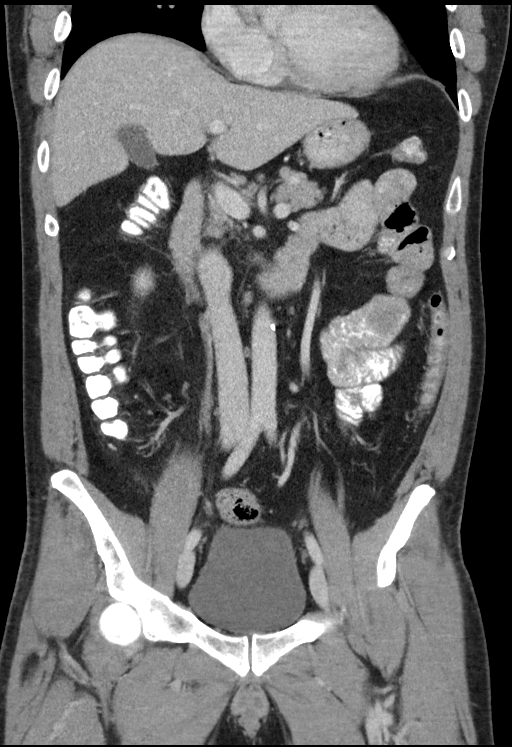
[im 47/85  soft-tissue]
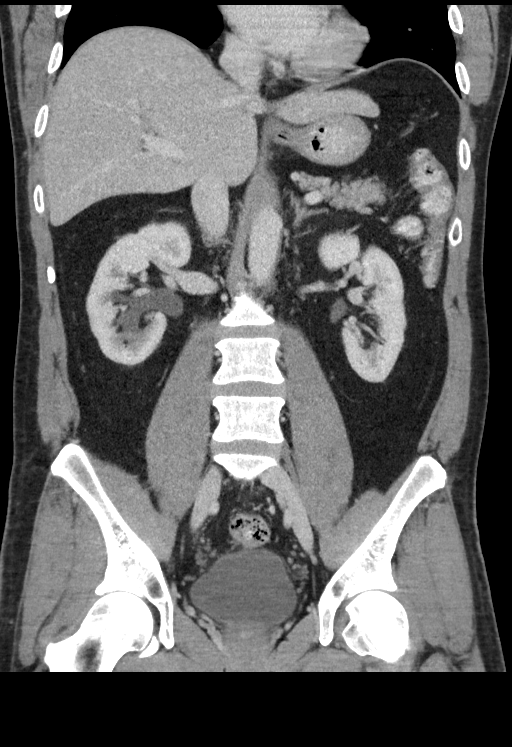

[15 of 46 positions shown; findings below may reference images not displayed]

FINDINGS: Lower chest: Minimal dependent bibasilar atelectasis

Hepatobiliary: Gallbladder and liver normal appearance. No biliary
dilatation.

Pancreas: Normal appearance

Spleen: Normal appearance

Adrenals/Urinary Tract: Adrenal glands normal appearance. Mild RIGHT
hydronephrosis secondary to a 7 mm RIGHT UPJ calculus. Additional
small BILATERAL nonobstructing renal calculi, tiny at inferior pole
RIGHT kidney, 5 mm diameter at mid to inferior pole LEFT kidney.
Small LEFT renal cysts. Ureters and bladder normal appearance next.

Stomach/Bowel: Normal appendix. Slight hyperemia of the sigmoid
mesocolon without wall thickening or definite surrounding pericolic
inflammatory changes. Segment of jejuno-jejunal intussusception in
the LEFT mid abdomen, likely physiologic, not seen on delayed images
through the kidneys. No evidence of bowel dilatation/obstruction.
Stomach and remaining bowel loops otherwise normal appearance.

Vascular/Lymphatic: Mild atherosclerotic calcification aorta. Aorta
normal caliber. Scattered normal size mesenteric nodes without
adenopathy.

Reproductive: Unremarkable prostate gland and seminal vesicles

Other: No free air or free fluid. No acute hernia or inflammatory
process.

Musculoskeletal: Unremarkable
IMPRESSION: RIGHT hydronephrosis secondary to a 7 mm RIGHT UPJ calculus.

Additional BILATERAL nonobstructing renal calculi.

Transient/physiologic jejuno-jejunal intussusception in LEFT mid
abdomen without evidence of bowel obstruction.

Aortic Atherosclerosis ([id number]-SJJ.J).

## 2017-11-29 IMAGING — CR DG ABDOMEN 1V
1 series · 2 of 2 positions shown · non-contrast
Comparison: 03/14/2017 and 03/04/2017

CLINICAL DATA: Lithotripsy 2 weeks ago. History of bilateral
stones.

EXAM:
ABDOMEN - 1 VIEW

[Series 1: dg abd 1 view · 0.14mm/px · 2 of 2 slices shown]
[im 1/2]
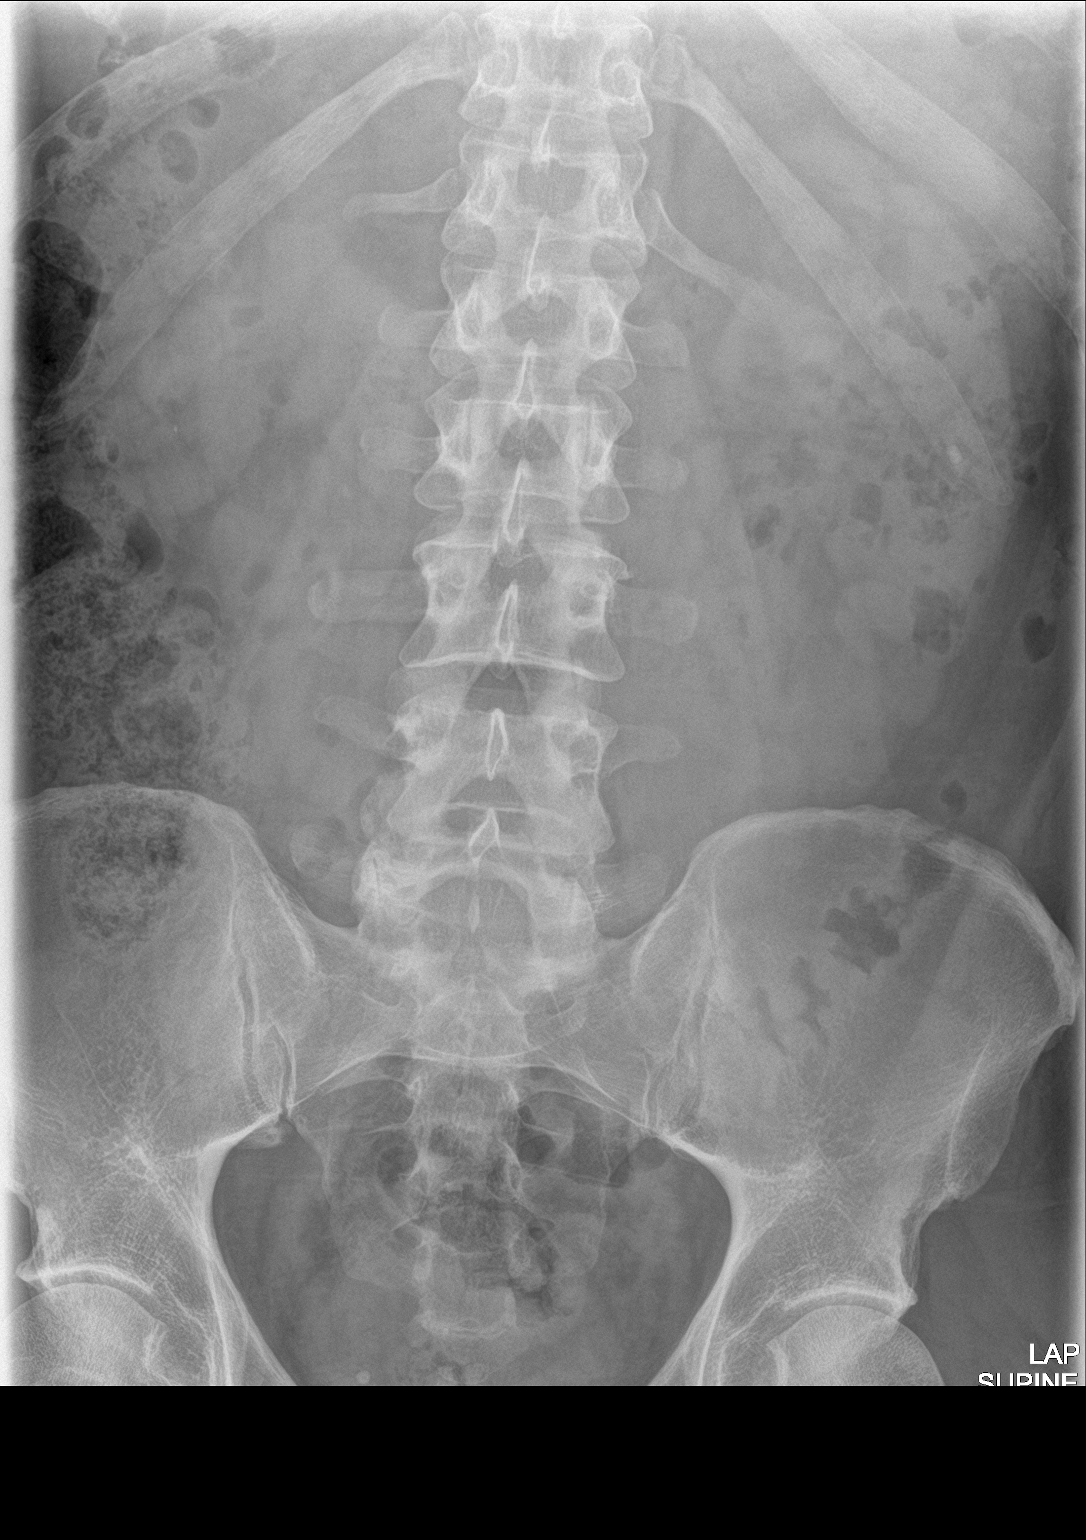
[im 2/2]
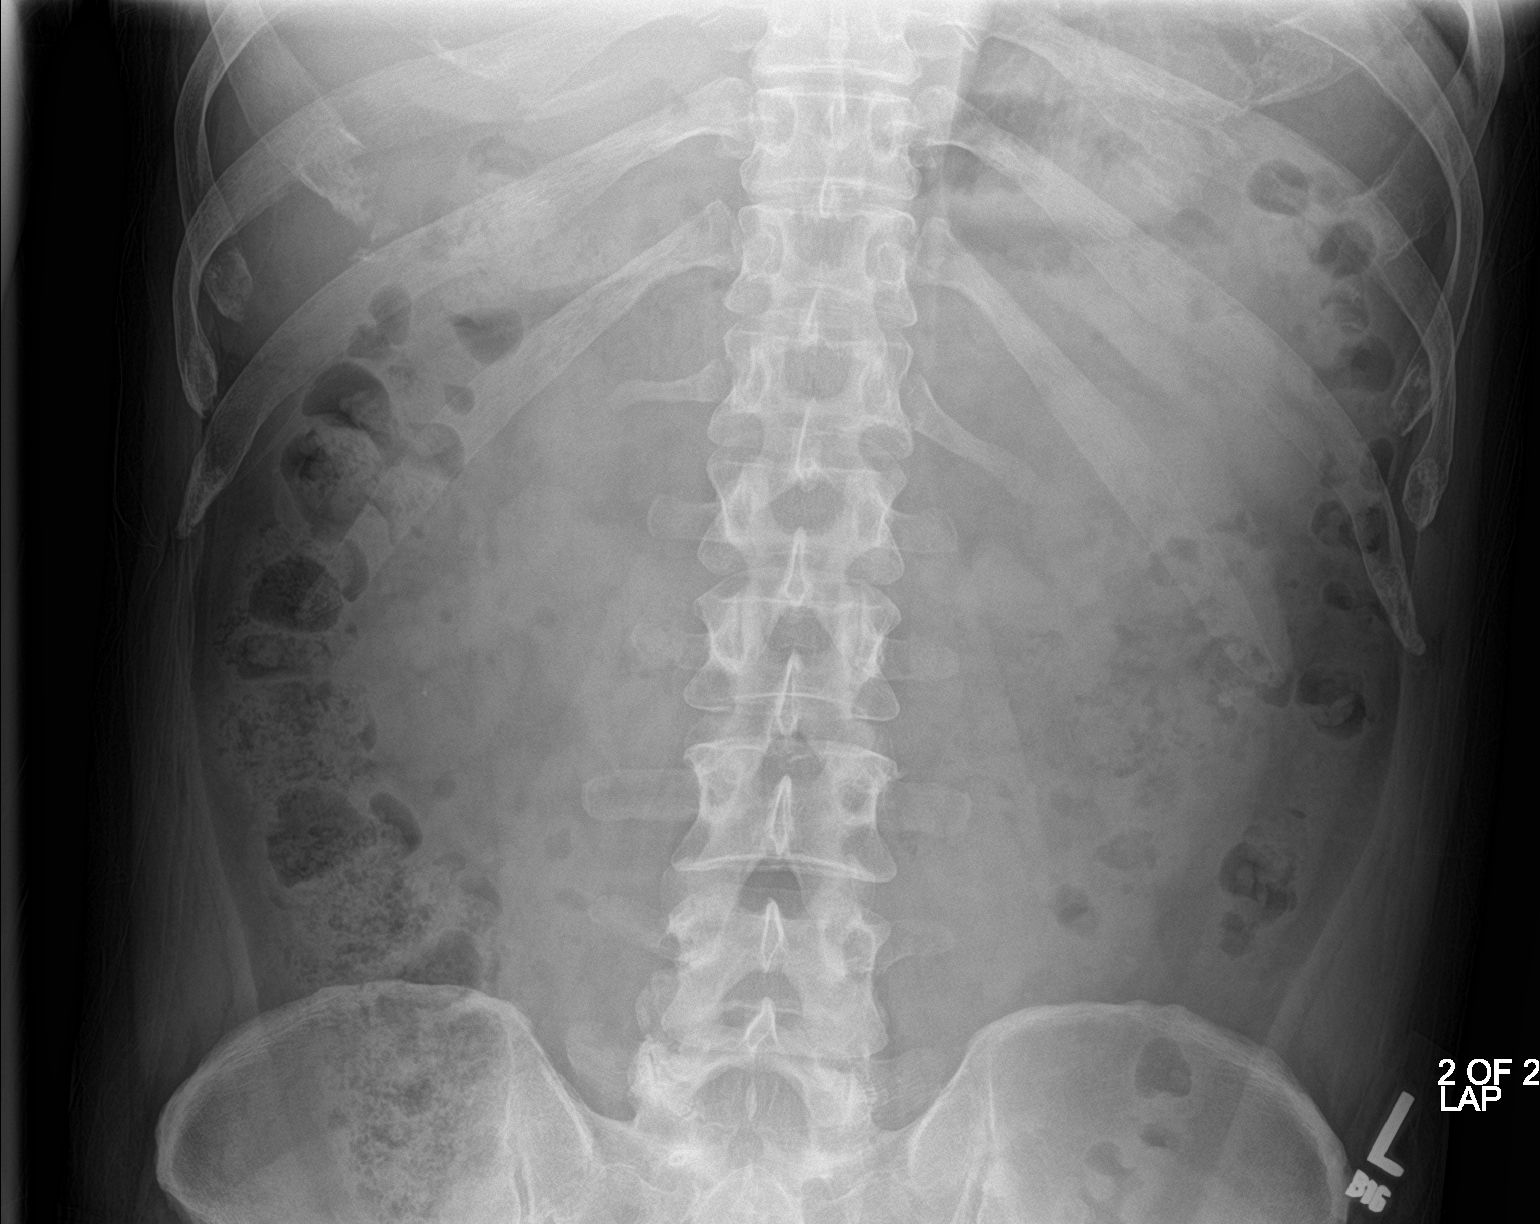

[2 of 2 positions shown; findings below may reference images not displayed]

FINDINGS: Stable 6 mm stone in the left kidney. Question a tiny stone in the
proximal right ureter between the L2 and L3 transverse processes.
Stable punctate stone in the right kidney lower pole. Stable
phlebolith in the right hemipelvis. No acute bone abnormality.
IMPRESSION: Right ureter stone is no longer confidently identified. There is a
questionable density which could be in the proximal right ureter.

Bilateral renal calculi.

## 2024-04-14 DIAGNOSIS — Z0000 Encounter for general adult medical examination without abnormal findings: Secondary | ICD-10-CM | POA: Diagnosis not present

## 2024-04-21 DIAGNOSIS — Z0000 Encounter for general adult medical examination without abnormal findings: Secondary | ICD-10-CM | POA: Diagnosis not present
